# Patient Record
Sex: Male | Born: 1985 | Hispanic: Refuse to answer | Marital: Single | Smoking: Never smoker
Health system: Southern US, Community
[De-identification: ages and names within clinical notes are randomized; demographics above are authoritative.]

---

## 2004-03-04 ENCOUNTER — Emergency Department (HOSPITAL_COMMUNITY): Admission: EM | Admit: 2004-03-04 | Discharge: 2004-03-04 | Payer: Self-pay | Admitting: Emergency Medicine

## 2009-10-05 ENCOUNTER — Emergency Department (HOSPITAL_COMMUNITY): Admission: EM | Admit: 2009-10-05 | Discharge: 2009-10-05 | Payer: Self-pay | Admitting: Emergency Medicine

## 2010-06-01 LAB — BASIC METABOLIC PANEL
BUN: 16 mg/dL (ref 6–23)
CO2: 22 mEq/L (ref 19–32)
Calcium: 9.5 mg/dL (ref 8.4–10.5)
Chloride: 109 mEq/L (ref 96–112)
Creatinine, Ser: 0.81 mg/dL (ref 0.4–1.5)
GFR calc Af Amer: >60 mL/min (ref >60)
GFR calc non Af Amer: >60 mL/min (ref >60)
Glucose, Bld: 103 mg/dL — ABNORMAL HIGH (ref 70–99)
Potassium: 3.5 mEq/L (ref 3.5–5.1)
Sodium: 139 mEq/L (ref 135–145)

## 2010-06-01 LAB — URINALYSIS, ROUTINE W REFLEX MICROSCOPIC
Bilirubin Urine: NEGATIVE
Glucose, UA: NEGATIVE mg/dL
Hgb urine dipstick: NEGATIVE
Ketones, ur: NEGATIVE mg/dL
Nitrite: NEGATIVE
Protein, ur: NEGATIVE mg/dL
Specific Gravity, Urine: 1.03 (ref 1.005–1.030)
Urobilinogen, UA: 0.2 mg/dL (ref 0.0–1.0)
pH: 5.5 (ref 5.0–8.0)

## 2010-06-01 LAB — CBC
HCT: 42.2 % (ref 39.0–52.0)
Hemoglobin: 14.5 g/dL (ref 13.0–17.0)
MCH: 28.9 pg (ref 26.0–34.0)
MCHC: 34.4 g/dL (ref 30.0–36.0)
MCV: 84 fL (ref 78.0–100.0)
Platelets: 180 10*3/uL (ref 150–400)
RBC: 5.03 MIL/uL (ref 4.22–5.81)
RDW: 13.4 % (ref 11.5–15.5)
WBC: 6.5 10*3/uL (ref 4.0–10.5)

## 2010-06-01 LAB — DIFFERENTIAL
Basophils Absolute: 0 10*3/uL (ref 0.0–0.1)
Basophils Relative: 0 % (ref 0–1)
Eosinophils Absolute: 0.6 10*3/uL (ref 0.0–0.7)
Eosinophils Relative: 9 % — ABNORMAL HIGH (ref 0–5)
Lymphocytes Relative: 36 % (ref 12–46)
Lymphs Abs: 2.4 10*3/uL (ref 0.7–4.0)
Monocytes Absolute: 0.4 10*3/uL (ref 0.1–1.0)
Monocytes Relative: 6 % (ref 3–12)
Neutro Abs: 3.1 10*3/uL (ref 1.7–7.7)
Neutrophils Relative %: 48 % (ref 43–77)

## 2010-06-01 LAB — GLUCOSE, CAPILLARY: Glucose-Capillary: 95 mg/dL (ref 70–99)

## 2011-07-02 ENCOUNTER — Emergency Department (HOSPITAL_COMMUNITY)
Admission: EM | Admit: 2011-07-02 | Discharge: 2011-07-02 | Disposition: A | Discharge status: Home / Self Care | Payer: Self-pay | Attending: Emergency Medicine | Admitting: Emergency Medicine

## 2011-07-02 ENCOUNTER — Encounter (HOSPITAL_COMMUNITY): Payer: Self-pay | Admitting: *Deleted

## 2011-07-02 DIAGNOSIS — R110 Nausea: Secondary | ICD-10-CM | POA: Insufficient documentation

## 2011-07-02 DIAGNOSIS — R51 Headache: Secondary | ICD-10-CM | POA: Insufficient documentation

## 2011-07-02 DIAGNOSIS — J329 Chronic sinusitis, unspecified: Secondary | ICD-10-CM | POA: Insufficient documentation

## 2011-07-02 MED ORDER — AZITHROMYCIN 250 MG PO TABS: 250.0000 mg | ORAL_TABLET | Freq: Every day | ORAL | Status: AC

## 2011-07-02 MED ORDER — KETOROLAC TROMETHAMINE 60 MG/2ML IM SOLN
60.0000 mg | Freq: Once | INTRAMUSCULAR | Status: AC
  Administered 2011-07-02: 60 mg via INTRAMUSCULAR
  Filled 2011-07-02: qty 2

## 2011-07-02 MED ORDER — NAPROXEN 500 MG PO TABS: 500.0000 mg | ORAL_TABLET | Freq: Two times a day (BID) | ORAL | Status: AC

## 2011-07-02 MED ORDER — LORATADINE 10 MG PO TABS: 10.0000 mg | ORAL_TABLET | Freq: Every day | ORAL | Status: DC

## 2011-07-02 NOTE — ED Provider Notes (Signed)
History     CSN: [id number]  Arrival date & time 07/02/11  0445   First MD Initiated Contact with Patient 07/02/11 0531      Chief Complaint  Patient presents with  . Headache  . Nausea    (Consider location/radiation/quality/duration/timing/severity/associated sxs/prior treatment) HPI Comments: 26 year old male with a history of headache, left-sided facial pain and nasal discharge with borderline fevers. This started approximately 3 days ago, has been intermittent but becoming more consistent over the day and night. He said one day with no relief of his headache and has now had some nausea. According to the spouse he develop this approximately once a year and they think it is admit to some allergies. They do admit that he gets some sneezing and some eye itching. He has had no medication prior to arrival.  The patient denies diarrhea, rash, abdominal or chest pain, shortness of breath, neck pain or stiffness. He has no change in his hearing and no year pain. He has no change in vision  Patient is a 26 y.o. male presenting with headaches. The history is provided by the patient and the spouse.  Headache     History reviewed. No pertinent past medical history.  History reviewed. No pertinent past surgical history.  History reviewed. No pertinent family history.  History  Substance Use Topics  . Smoking status: Never Smoker   . Smokeless tobacco: Not on file  . Alcohol Use: No      Review of Systems  Neurological: Positive for headaches.  All other systems reviewed and are negative.    Allergies  Review of patient's allergies indicates not on file.  Home Medications   Current Outpatient Rx  Name Route Sig Dispense Refill  . AZITHROMYCIN 250 MG PO TABS Oral Take 1 tablet (250 mg total) by mouth daily. 500mg  PO day 1, then 250mg  PO days 205 6 tablet 0  . LORATADINE 10 MG PO TABS Oral Take 1 tablet (10 mg total) by mouth daily. 30 tablet 0  . NAPROXEN 500 MG PO TABS Oral  Take 1 tablet (500 mg total) by mouth 2 (two) times daily with a meal. 30 tablet 0    BP 108/67  Pulse 77  Temp(Src) 99.1 F (37.3 C) (Oral)  Resp 18  Ht 5\' 3"  (1.6 m)  Wt 164 lb (74.39 kg)  BMI 29.05 kg/m2  SpO2 95%  Physical Exam  Nursing note and vitals reviewed. Constitutional: He appears well-developed and well-nourished. No distress.  HENT:  Head: Normocephalic and atraumatic.  Mouth/Throat: Oropharynx is clear and moist. No oropharyngeal exudate.       Tender to palpation over the left frontal and maxillary sinuses, nose with swollen turbinates and purulent discharge, oropharynx clear with moist mucous membranes. Tympanic membranes normal bilaterally  Eyes: Conjunctivae and EOM are normal. Pupils are equal, round, and reactive to light. Right eye exhibits no discharge. Left eye exhibits no discharge. No scleral icterus.  Neck: Normal range of motion. Neck supple. No JVD present. No thyromegaly present.  Cardiovascular: Normal rate, regular rhythm, normal heart sounds and intact distal pulses.  Exam reveals no gallop and no friction rub.   No murmur heard. Pulmonary/Chest: Effort normal and breath sounds normal. No respiratory distress. He has no wheezes. He has no rales.  Abdominal: Soft. Bowel sounds are normal. He exhibits no distension and no mass. There is no tenderness.  Musculoskeletal: Normal range of motion. He exhibits no edema and no tenderness.  Lymphadenopathy:    He  has no cervical adenopathy.  Neurological: He is alert. Coordination normal.  Skin: Skin is warm and dry. No rash noted. No erythema.  Psychiatric: He has a normal mood and affect. His behavior is normal.    ED Course  Procedures (including critical care time)  Labs Reviewed - No data to display No results found.   1. Sinusitis       MDM  The patient is well-appearing with normal vital signs and no focal neurologic deficits. He is ambulatory, speaks clearly and follows commands without  difficulty. We'll treat with intramuscular Toradol and home with several medications. I suspect this is sinusitis with a resultant headache.  Discharge Prescriptions include:   #1 Naprosyn #2 Zithromax #3 Claritin        Vida Roller, MD 07/02/11 [phone]

## 2011-07-02 NOTE — ED Notes (Signed)
MD at bedside to evaluate.

## 2011-07-02 NOTE — ED Notes (Signed)
Into room to see patient. States he started feeling bad Saturday with body aches and a headache. States headache got worse this morning when he woke up. Is a constant, sharp pain in left side of head and behind eye. Light makes it worse; nothing makes it worse. Has been feeling nauseated this morning. Active bowel sounds in all fields. Soft, flat, nontender. Pupils 4mm bilaterally, PERRLA. Family at bedside. Call bell within reach.

## 2011-07-02 NOTE — ED Notes (Signed)
Pt started feeling feverish with body aches on Sunday, progressed into nausea and headache with no relief x1 day

## 2011-10-02 ENCOUNTER — Emergency Department (HOSPITAL_COMMUNITY)
Admission: EM | Admit: 2011-10-02 | Discharge: 2011-10-02 | Disposition: A | Discharge status: Home / Self Care | Payer: Self-pay | Attending: Emergency Medicine | Admitting: Emergency Medicine

## 2011-10-02 ENCOUNTER — Encounter (HOSPITAL_COMMUNITY): Payer: Self-pay | Admitting: *Deleted

## 2011-10-02 DIAGNOSIS — R21 Rash and other nonspecific skin eruption: Secondary | ICD-10-CM | POA: Insufficient documentation

## 2011-10-02 DIAGNOSIS — L255 Unspecified contact dermatitis due to plants, except food: Secondary | ICD-10-CM

## 2011-10-02 MED ORDER — PREDNISONE 10 MG PO TABS: ORAL_TABLET | ORAL | Status: DC

## 2011-10-02 MED ORDER — DEXAMETHASONE SODIUM PHOSPHATE 4 MG/ML IJ SOLN
10.0000 mg | Freq: Once | INTRAMUSCULAR | Status: AC
  Administered 2011-10-02: 10 mg via INTRAMUSCULAR
  Filled 2011-10-02: qty 3

## 2011-10-02 MED ORDER — DIPHENHYDRAMINE HCL 25 MG PO CAPS
25.0000 mg | ORAL_CAPSULE | Freq: Once | ORAL | Status: AC
  Administered 2011-10-02: 25 mg via ORAL
  Filled 2011-10-02: qty 1

## 2011-10-02 NOTE — ED Provider Notes (Signed)
History     CSN: [id number]  Arrival date & time 10/02/11  1731   First MD Initiated Contact with Patient 10/02/11 1739      Chief Complaint  Patient presents with  . Rash    (Consider location/radiation/quality/duration/timing/severity/associated sxs/prior treatment) Patient is a 26 y.o. male presenting with rash. The history is provided by the patient.  Rash  This is a new problem. The current episode started more than 2 days ago. The problem has been gradually worsening. The problem is associated with plant contact. There has been no fever. The rash is present on the left arm, left upper leg, left lower leg, right arm, right upper leg and right lower leg. The patient is experiencing no pain. Associated symptoms include itching. Pertinent negatives include no blisters, no pain and no weeping. He has tried nothing for the symptoms. The treatment provided no relief.    History reviewed. No pertinent past medical history.  History reviewed. No pertinent past surgical history.  History reviewed. No pertinent family history.  History  Substance Use Topics  . Smoking status: Never Smoker   . Smokeless tobacco: Not on file  . Alcohol Use: No      Review of Systems  Constitutional: Negative for fever, chills, activity change and appetite change.  HENT: Negative for sore throat, facial swelling, trouble swallowing, neck pain and neck stiffness.   Respiratory: Negative for chest tightness, shortness of breath and wheezing.   Skin: Positive for itching and rash. Negative for wound.  Neurological: Negative for dizziness, weakness, numbness and headaches.  All other systems reviewed and are negative.    Allergies  Review of patient's allergies indicates no known allergies.  Home Medications  No current outpatient prescriptions on file.  BP 111/74  Pulse 56  Temp 98 F (36.7 C) (Oral)  Resp 18  Ht 5\' 7"  (1.702 m)  Wt 165 lb (74.844 kg)  BMI 25.84 kg/m2  SpO2  100%  Physical Exam  Nursing note and vitals reviewed. Constitutional: He is oriented to person, place, and time. He appears well-developed and well-nourished. No distress.  HENT:  Head: Normocephalic and atraumatic.  Mouth/Throat: Oropharynx is clear and moist.  Neck: Normal range of motion. Neck supple.  Cardiovascular: Normal rate, regular rhythm and normal heart sounds.   Pulmonary/Chest: Effort normal and breath sounds normal.  Musculoskeletal: He exhibits no edema and no tenderness.  Lymphadenopathy:    He has no cervical adenopathy.  Neurological: He is alert and oriented to person, place, and time. He exhibits normal muscle tone. Coordination normal.  Skin: Rash noted. There is erythema.       Erythematous papular rash to the bilateral upper and lower extremities.  Few linear vesicles also present.  Areas of excoriation to the legs.  No drainage or edema    ED Course  Procedures (including critical care time)  Labs Reviewed - No data to display      MDM     Scattered papular rash to the bilateral upper and lower extremities.  Few areas of excoriation.  Several linear vesicles also present.  Appears c/w plant dermatitis.  No drainage , or edema.  No facial swelling,. Airway patent  The patient appears reasonably screened and/or stabilized for discharge and I doubt any other medical condition or other [facility] requiring further screening, evaluation, or treatment in the ED at this time prior to discharge.   Given IM injection of decadron, po benadryl  Prescribed: Prednisone taper Advised to take OTC benadryl  Berania Peedin L. [place], [place] 10/02/11 1826

## 2011-10-02 NOTE — ED Notes (Signed)
Itching red rash to arms, and legs.

## 2011-10-03 ENCOUNTER — Encounter (HOSPITAL_COMMUNITY): Payer: Self-pay | Admitting: *Deleted

## 2011-10-06 NOTE — ED Provider Notes (Signed)
Medical screening examination/treatment/procedure(s) were performed by non-physician practitioner and as supervising physician I was immediately available for consultation/collaboration.  Almer Bushey, MD 10/06/11 0835 

## 2013-07-13 ENCOUNTER — Encounter (HOSPITAL_COMMUNITY): Payer: Self-pay | Admitting: Emergency Medicine

## 2013-07-13 ENCOUNTER — Emergency Department (HOSPITAL_COMMUNITY)
Admission: EM | Admit: 2013-07-13 | Discharge: 2013-07-13 | Disposition: A | Discharge status: Home / Self Care | Payer: Self-pay | Attending: Emergency Medicine | Admitting: Emergency Medicine

## 2013-07-13 DIAGNOSIS — Y990 Civilian activity done for income or pay: Secondary | ICD-10-CM | POA: Insufficient documentation

## 2013-07-13 DIAGNOSIS — Z23 Encounter for immunization: Secondary | ICD-10-CM | POA: Insufficient documentation

## 2013-07-13 DIAGNOSIS — Z79899 Other long term (current) drug therapy: Secondary | ICD-10-CM | POA: Insufficient documentation

## 2013-07-13 DIAGNOSIS — IMO0002 Reserved for concepts with insufficient information to code with codable children: Secondary | ICD-10-CM

## 2013-07-13 DIAGNOSIS — S61209A Unspecified open wound of unspecified finger without damage to nail, initial encounter: Secondary | ICD-10-CM | POA: Insufficient documentation

## 2013-07-13 DIAGNOSIS — W268XXA Contact with other sharp object(s), not elsewhere classified, initial encounter: Secondary | ICD-10-CM | POA: Insufficient documentation

## 2013-07-13 DIAGNOSIS — Y929 Unspecified place or not applicable: Secondary | ICD-10-CM | POA: Insufficient documentation

## 2013-07-13 DIAGNOSIS — Y9389 Activity, other specified: Secondary | ICD-10-CM | POA: Insufficient documentation

## 2013-07-13 MED ORDER — LIDOCAINE HCL (PF) 1 % IJ SOLN
INTRAMUSCULAR | Status: AC
  Administered 2013-07-13: 23:00:00
  Filled 2013-07-13: qty 5

## 2013-07-13 MED ORDER — TETANUS-DIPHTH-ACELL PERTUSSIS 5-2.5-18.5 LF-MCG/0.5 IM SUSP
0.5000 mL | Freq: Once | INTRAMUSCULAR | Status: AC
  Administered 2013-07-13: 0.5 mL via INTRAMUSCULAR
  Filled 2013-07-13: qty 0.5

## 2013-07-13 MED ORDER — CEPHALEXIN 500 MG PO CAPS: 500.0000 mg | ORAL_CAPSULE | Freq: Four times a day (QID) | ORAL | Status: AC

## 2013-07-13 NOTE — ED Notes (Signed)
Pt reports cutting finger on a piece of metal.  Bleeding controlled at this time.

## 2013-07-13 NOTE — Discharge Instructions (Signed)

## 2013-07-13 NOTE — ED Notes (Signed)
Pt with lac to left index finger by piece of metal, occurred 6-7 hours ago, unknown tetanus shot but believes may have been greater than 10 years

## 2013-07-14 NOTE — ED Provider Notes (Signed)
Medical screening examination/treatment/procedure(s) were performed by non-physician practitioner and as supervising physician I was immediately available for consultation/collaboration.   EKG Interpretation None      Samil Mecham, MD, FACEP   Stanely Sexson L Yalena Colon, MD 07/14/13 2346 

## 2013-07-14 NOTE — ED Provider Notes (Signed)
CSN: [id number]     Arrival date & time 07/13/13  2032 History   First MD Initiated Contact with Patient 07/13/13 2106     Chief Complaint  Patient presents with  . Laceration     (Consider location/radiation/quality/duration/timing/severity/associated sxs/prior Treatment) Patient is a 28 y.o. male presenting with skin laceration. The history is provided by the patient and the spouse.  Laceration Location:  Hand Hand laceration location:  L finger Length (cm):  1 Depth:  Through dermis Quality comment:  Curved Bleeding: controlled with pressure   Time since incident:  6 hours (He was at work and had no transportation to arrive here sooner) Laceration mechanism:  Metal edge Pain details:    Quality:  Aching   Severity:  Mild   Timing:  Constant   Progression:  Unchanged Foreign body present:  No foreign bodies Relieved by:  Pressure Worsened by:  Nothing tried Ineffective treatments:  None tried Tetanus status:  Unknown   History reviewed. No pertinent past medical history. History reviewed. No pertinent past surgical history. History reviewed. No pertinent family history. History  Substance Use Topics  . Smoking status: Never Smoker   . Smokeless tobacco: Not on file  . Alcohol Use: No    Review of Systems  Constitutional: Negative for fever and chills.  Respiratory: Negative for shortness of breath and wheezing.   Skin: Positive for wound.  Neurological: Negative for numbness.      Allergies  Review of patient's allergies indicates no known allergies.  Home Medications   Prior to Admission medications   Medication Sig Start Date End Date Taking? Authorizing Provider  cephALEXin (KEFLEX) 500 MG capsule Take 1 capsule (500 mg total) by mouth 4 (four) times daily. 07/13/13   Burgess AmorJulie Abe Schools, PA-C   BP 124/71  Pulse 63  Temp(Src) 98.1 F (36.7 C) (Oral)  Resp 20  Ht 5\' 6"  (1.676 m)  Wt 170 lb (77.111 kg)  BMI 27.45 kg/m2  SpO2 99% Physical Exam   Constitutional: He is oriented to person, place, and time. He appears well-developed and well-nourished.  HENT:  Head: Normocephalic.  Cardiovascular: Normal rate.   Pulmonary/Chest: Effort normal.  Musculoskeletal: He exhibits no edema and no tenderness.  Neurological: He is alert and oriented to person, place, and time. No sensory deficit.  Skin: Laceration noted.  1 cm laceration dorsal middle phalanx of left index finger.  Hemostatic.  Distal sensation present,  But he endorses slight numbness dorsally. Less than 2 sec distal cap refill.    ED Course  NERVE BLOCK Date/Time: 07/13/2013 9:30 PM Performed by: Burgess AmorIDOL, Naheem Mosco Authorized by: Burgess AmorIDOL, Jaely Silman Consent: Verbal consent obtained. Risks and benefits: risks, benefits and alternatives were discussed Consent given by: patient Patient identity confirmed: verbally with patient Time out: Immediately prior to procedure a "time out" was called to verify the correct patient, procedure, equipment, support staff and site/side marked as required. Indications: pain relief Body area: upper extremity Nerve: digital Laterality: left Preparation: Patient was prepped and draped in the usual sterile fashion. Patient position: sitting Needle gauge: 27 G Location technique: anatomical landmarks Local anesthetic: lidocaine 1% without epinephrine Anesthetic total: 2 ml Outcome: pain improved Patient tolerance: Patient tolerated the procedure well with no immediate complications.   (including critical care time)  LACERATION REPAIR Performed by: Burgess AmorJulie Bralen Wiltgen Authorized by: Burgess AmorJulie Jenness Stemler Consent: Verbal consent obtained. Risks and benefits: risks, benefits and alternatives were discussed Consent given by: patient Patient identity confirmed: provided demographic data Prepped and Draped in normal  sterile fashion Wound explored  Laceration Location: left index finger  Laceration Length: 1cm  No Foreign Bodies seen or palpated  Anesthesia: digital  block Local anesthetic: lidocaine 1% without epinephrine  Anesthetic total: 2 ml  Irrigation method: syringe Amount of cleaning: copious after cleaning with Saf clens spray. Skin closure: ethilon 4-0  Number of sutures: 4  Technique: simple interrupted  Patient tolerance: Patient tolerated the procedure well with no immediate complications.  Labs Review Labs Reviewed - No data to display  Imaging Review No results found.   EKG Interpretation None      MDM   Final diagnoses:  Laceration    Wound care instructions given.  Pt advised to have sutures removed in 10 days,  Return here sooner for any signs of infection including redness, swelling, worse pain or drainage of pus.  Given the age of the wound,  He was placed on keflex for 7 days to help minimize risk of infection.  Tetanus updated.       Burgess AmorJulie Tremeka Helbling, PA-C 07/14/13 [phone]

## 2013-07-24 ENCOUNTER — Encounter (HOSPITAL_COMMUNITY): Payer: Self-pay | Admitting: Emergency Medicine

## 2013-07-24 ENCOUNTER — Emergency Department (HOSPITAL_COMMUNITY)
Admission: EM | Admit: 2013-07-24 | Discharge: 2013-07-24 | Disposition: A | Discharge status: Home / Self Care | Payer: Self-pay | Attending: Emergency Medicine | Admitting: Emergency Medicine

## 2013-07-24 DIAGNOSIS — Z4802 Encounter for removal of sutures: Secondary | ICD-10-CM | POA: Insufficient documentation

## 2013-07-24 DIAGNOSIS — Z79899 Other long term (current) drug therapy: Secondary | ICD-10-CM | POA: Insufficient documentation

## 2013-07-24 DIAGNOSIS — M79609 Pain in unspecified limb: Secondary | ICD-10-CM | POA: Insufficient documentation

## 2013-07-24 NOTE — ED Provider Notes (Signed)
Medical screening examination/treatment/procedure(s) were performed by non-physician practitioner and as supervising physician I was immediately available for consultation/collaboration.   EKG Interpretation None        Brigg Cape L Brailon Don, MD 07/24/13 2131 

## 2013-07-24 NOTE — ED Notes (Signed)
Had sutures placed last Wednesday. Came in for removal of sutures

## 2013-07-24 NOTE — Discharge Instructions (Signed)
He a small area of your to the index finger that has not healed completely. Please leave the Steri-Strip in place, and allow it to come off on its own. Please see your doctor, or return to the emergency department if any unusual swelling, or red streaks going up the arm, or high fever. Please do not apply any lotion or petroleum products to the Steri-Strip area, as this will break down the glue. Suture Removal, Care After Refer to this sheet in the next few weeks. These instructions provide you with information on caring for yourself after your procedure. Your health care provider may also give you more specific instructions. Your treatment has been planned according to current medical practices, but problems sometimes occur. Call your health care provider if you have any problems or questions after your procedure. WHAT TO EXPECT AFTER THE PROCEDURE After your stitches (sutures) are removed, it is typical to have the following:  Some discomfort and swelling in the wound area.  Slight redness in the area. HOME CARE INSTRUCTIONS   If you have skin adhesive strips over the wound area, do not take the strips off. They will fall off on their own in a few days. If the strips remain in place after 14 days, you may remove them.  Change any bandages (dressings) at least once a day or as directed by your health care provider. If the bandage sticks, soak it off with warm, soapy water.  Apply cream or ointment only as directed by your health care provider. If using cream or ointment, wash the area with soap and water 2 times a day to remove all the cream or ointment. Rinse off the soap and pat the area dry with a clean towel.  Keep the wound area dry and clean. If the bandage becomes wet or dirty, or if it develops a bad smell, change it as soon as possible.  Continue to protect the wound from injury.  Use sunscreen when out in the sun. New scars become sunburned easily. SEEK MEDICAL CARE IF:  You have  increasing redness, swelling, or pain in the wound.  You see pus coming from the wound.  You have a fever.  You notice a bad smell coming from the wound or dressing.  Your wound breaks open (edges not staying together). Document Released: 11/26/2000 Document Revised: 12/22/2012 Document Reviewed: 10/13/2012 [facility] Patient Information 2014 [employer], [place].

## 2013-07-24 NOTE — ED Provider Notes (Signed)
CSN: [id number]     Arrival date & time 07/24/13  1844 History  This chart was scribed for [facility]. [name], [place], working with Benny LennertJoseph L Zammit, MD, by Beverly MilchJ Harrison Collins ED Scribe. This patient was seen in room APFT21/APFT21 and the patient's care was started at 6:58 PM.    Chief Complaint  Patient presents with  . Suture / Staple Removal    Patient is a 28 y.o. male presenting with suture removal. The history is provided by the patient. No language interpreter was used.  Suture / Staple Removal This is a new problem. The current episode started more than 2 days ago. The problem occurs constantly. The problem has not changed since onset.Pertinent negatives include no chest pain, no abdominal pain, no headaches and no shortness of breath. Nothing aggravates the symptoms. Nothing relieves the symptoms. He has tried nothing for the symptoms. The treatment provided no relief.    HPI Comments: Francis Garrett is a 28 y.o. male who presents to the Emergency Department for removal of sutures on the left index finger placed 4 days ago.    History reviewed. No pertinent past medical history. History reviewed. No pertinent past surgical history. History reviewed. No pertinent family history. History  Substance Use Topics  . Smoking status: Never Smoker   . Smokeless tobacco: Not on file  . Alcohol Use: No    Review of Systems  Respiratory: Negative for shortness of breath.   Cardiovascular: Negative for chest pain.  Gastrointestinal: Negative for abdominal pain.  Musculoskeletal: Positive for arthralgias.  Neurological: Negative for headaches.  All other systems reviewed and are negative.   Allergies  Review of patient's allergies indicates no known allergies.   Home Medications   Prior to Admission medications   Medication Sig Start Date End Date Taking? Authorizing Provider  cephALEXin (KEFLEX) 500 MG capsule Take 1 capsule (500 mg total) by mouth 4 (four) times daily.  07/13/13   Burgess AmorJulie Idol, PA-C    Triage Vitals: BP 114/66  Pulse 61  Temp(Src) 97.9 F (36.6 C) (Oral)  Ht 5\' 4"  (1.626 m)  Wt 170 lb (77.111 kg)  BMI 29.17 kg/m2  SpO2 100%   Physical Exam  Nursing note and vitals reviewed. Constitutional: He is oriented to person, place, and time. He appears well-developed and well-nourished. No distress.  HENT:  Head: Normocephalic and atraumatic.  Right Ear: External ear normal.  Left Ear: External ear normal.  Eyes: Conjunctivae and EOM are normal. Right eye exhibits no discharge. Left eye exhibits no discharge. No scleral icterus.  Neck: Normal range of motion. Neck supple. No tracheal deviation present.  Cardiovascular: Normal rate.   Pulmonary/Chest: Effort normal. No stridor. No respiratory distress.  Musculoskeletal: He exhibits no edema.       Hands: Neurological: He is alert and oriented to person, place, and time. He has normal reflexes. Cranial nerve deficit: no gross deficits.  Skin: Skin is warm and dry. No rash noted. He is not diaphoretic.  Psychiatric: He has a normal mood and affect. His behavior is normal.    ED Course  SUTURE REMOVAL Date/Time: 07/24/2013 7:09 PM Performed by: Kathie DikeBRYANT, Jacquette Canales M Authorized by: Kathie DikeBRYANT, Oprah Camarena M Consent: Verbal consent obtained. Risks and benefits: risks, benefits and alternatives were discussed Consent given by: patient Patient understanding: patient states understanding of the procedure being performed Patient identity confirmed: arm band Time out: Immediately prior to procedure a "time out" was called to verify the correct patient, procedure, equipment, support staff  and site/side marked as required. Body area: upper extremity Location details: left index finger Wound Appearance: clean Sutures Removed: 3 Staples Removed: 0 Post-removal: Steri-Strips applied Facility: sutures placed in this facility Patient tolerance: Patient tolerated the procedure well with no immediate  complications. Comments: The top layer of skin at the laceration site of the left index finger has not completely closed. One of the sutures had come loose. The area was repaired with a Steri-Strip, with good wound edge approximation.   (including critical care time)  DIAGNOSTIC STUDIES: Oxygen Saturation is 100% on RA, normal by my interpretation.     COORDINATION OF CARE: 7:06 PM- Pt advised of plan for treatment and pt agrees.    Labs Review Labs Reviewed - No data to display  Imaging Review No results found.   EKG Interpretation None      MDM Patient had sutures placed approximately 10 days ago. Examination reveals a small area that has not healed completely. This was repaired with a Steri-Strip. The patient now has good wound edge approximation. The patient's been given instructions to return if any unusual swelling, fever, or red streaks going up the arm. The patient is also been given instructions that the Steri-Strip will come off on its own in 5-7 days. The patient and the family member acknowledge understanding of these discharge instructions.    Final diagnoses:  None    **I have reviewed nursing notes, vital signs, and all appropriate lab and imaging results for this patient.   Kathie DikeHobson M Hymie Gorr, PA-C 07/24/13 [phone]

## 2020-10-07 ENCOUNTER — Emergency Department (HOSPITAL_COMMUNITY)
Admission: EM | Admit: 2020-10-07 | Discharge: 2020-10-07 | Disposition: A | Discharge status: Home / Self Care | Payer: Self-pay | Attending: Emergency Medicine | Admitting: Emergency Medicine

## 2020-10-07 ENCOUNTER — Encounter (HOSPITAL_COMMUNITY): Payer: Self-pay | Admitting: Emergency Medicine

## 2020-10-07 ENCOUNTER — Emergency Department (HOSPITAL_COMMUNITY): Payer: Self-pay

## 2020-10-07 ENCOUNTER — Other Ambulatory Visit: Payer: Self-pay

## 2020-10-07 DIAGNOSIS — R350 Frequency of micturition: Secondary | ICD-10-CM | POA: Insufficient documentation

## 2020-10-07 DIAGNOSIS — R519 Headache, unspecified: Secondary | ICD-10-CM | POA: Insufficient documentation

## 2020-10-07 DIAGNOSIS — R509 Fever, unspecified: Secondary | ICD-10-CM | POA: Insufficient documentation

## 2020-10-07 DIAGNOSIS — K5792 Diverticulitis of intestine, part unspecified, without perforation or abscess without bleeding: Secondary | ICD-10-CM

## 2020-10-07 DIAGNOSIS — Z20822 Contact with and (suspected) exposure to covid-19: Secondary | ICD-10-CM | POA: Insufficient documentation

## 2020-10-07 DIAGNOSIS — R3915 Urgency of urination: Secondary | ICD-10-CM | POA: Insufficient documentation

## 2020-10-07 DIAGNOSIS — K5900 Constipation, unspecified: Secondary | ICD-10-CM | POA: Insufficient documentation

## 2020-10-07 DIAGNOSIS — M545 Low back pain, unspecified: Secondary | ICD-10-CM | POA: Insufficient documentation

## 2020-10-07 DIAGNOSIS — R300 Dysuria: Secondary | ICD-10-CM | POA: Insufficient documentation

## 2020-10-07 DIAGNOSIS — R110 Nausea: Secondary | ICD-10-CM | POA: Insufficient documentation

## 2020-10-07 DIAGNOSIS — R1033 Periumbilical pain: Secondary | ICD-10-CM | POA: Insufficient documentation

## 2020-10-07 LAB — RESP PANEL BY RT-PCR (FLU A&B, COVID) ARPGX2
Influenza A by PCR: NEGATIVE
Influenza B by PCR: NEGATIVE
SARS Coronavirus 2 by RT PCR: NEGATIVE

## 2020-10-07 LAB — LIPASE, BLOOD: Lipase: 24 U/L (ref 11–51)

## 2020-10-07 LAB — COMPREHENSIVE METABOLIC PANEL
ALT: 22 U/L (ref 0–44)
AST: 23 U/L (ref 15–41)
Albumin: 4.4 g/dL (ref 3.5–5.0)
Alkaline Phosphatase: 98 U/L (ref 38–126)
Anion gap: 6 (ref 5–15)
BUN: 12 mg/dL (ref 6–20)
CO2: 24 mmol/L (ref 22–32)
Calcium: 8.5 mg/dL — ABNORMAL LOW (ref 8.9–10.3)
Chloride: 101 mmol/L (ref 98–111)
Creatinine, Ser: 0.78 mg/dL (ref 0.61–1.24)
GFR, Estimated: >60 mL/min (ref >60)
Glucose, Bld: 110 mg/dL — ABNORMAL HIGH (ref 70–99)
Potassium: 3.8 mmol/L (ref 3.5–5.1)
Sodium: 131 mmol/L — ABNORMAL LOW (ref 135–145)
Total Bilirubin: 1.1 mg/dL (ref 0.3–1.2)
Total Protein: 7.1 g/dL (ref 6.5–8.1)

## 2020-10-07 LAB — CBC
HCT: 43.3 % (ref 39.0–52.0)
Hemoglobin: 14.5 g/dL (ref 13.0–17.0)
MCH: 29.5 pg (ref 26.0–34.0)
MCHC: 33.5 g/dL (ref 30.0–36.0)
MCV: 88 fL (ref 80.0–100.0)
Platelets: 153 10*3/uL (ref 150–400)
RBC: 4.92 MIL/uL (ref 4.22–5.81)
RDW: 13.2 % (ref 11.5–15.5)
WBC: 8.6 10*3/uL (ref 4.0–10.5)
nRBC: 0 % (ref 0.0–0.2)

## 2020-10-07 LAB — URINALYSIS, ROUTINE W REFLEX MICROSCOPIC
Bilirubin Urine: NEGATIVE
Glucose, UA: NEGATIVE mg/dL
Hgb urine dipstick: NEGATIVE
Ketones, ur: NEGATIVE mg/dL
Leukocytes,Ua: NEGATIVE
Nitrite: NEGATIVE
Protein, ur: NEGATIVE mg/dL
Specific Gravity, Urine: 1.042 — ABNORMAL HIGH (ref 1.005–1.030)
pH: 6 (ref 5.0–8.0)

## 2020-10-07 MED ORDER — ONDANSETRON HCL 4 MG/2ML IJ SOLN
4.0000 mg | Freq: Once | INTRAMUSCULAR | Status: AC
  Administered 2020-10-07: 4 mg via INTRAVENOUS
  Filled 2020-10-07: qty 2

## 2020-10-07 MED ORDER — IOHEXOL 300 MG/ML  SOLN
100.0000 mL | Freq: Once | INTRAMUSCULAR | Status: AC | PRN
  Administered 2020-10-07: 100 mL via INTRAVENOUS

## 2020-10-07 MED ORDER — SODIUM CHLORIDE 0.9 % IV BOLUS
1000.0000 mL | Freq: Once | INTRAVENOUS | Status: AC
  Administered 2020-10-07: 1000 mL via INTRAVENOUS

## 2020-10-07 MED ORDER — ACETAMINOPHEN 325 MG PO TABS
650.0000 mg | ORAL_TABLET | Freq: Once | ORAL | Status: AC
  Administered 2020-10-07: 650 mg via ORAL
  Filled 2020-10-07: qty 2

## 2020-10-07 MED ORDER — CIPROFLOXACIN HCL 500 MG PO TABS: 500.0000 mg | ORAL_TABLET | Freq: Two times a day (BID) | ORAL | 0 refills | Status: AC

## 2020-10-07 MED ORDER — FENTANYL CITRATE (PF) 100 MCG/2ML IJ SOLN
50.0000 ug | Freq: Once | INTRAMUSCULAR | Status: AC
  Administered 2020-10-07: 50 ug via INTRAVENOUS
  Filled 2020-10-07: qty 2

## 2020-10-07 MED ORDER — METRONIDAZOLE 500 MG PO TABS: 500.0000 mg | ORAL_TABLET | Freq: Two times a day (BID) | ORAL | 0 refills | Status: AC

## 2020-10-07 NOTE — ED Provider Notes (Signed)
7:30 AM-checkout from Dr. Manus Gunning to evaluate patient after return of chest x-ray and urinalysis.  10:55 AM-chest x-ray normal, urinalysis normal.  CT done earlier indicated diverticulitis.  Vital signs today have been reassuring.  At this time, he is comfortable.  Abdomen is soft.  He points to site of abdominal pain just to the left and beneath the umbilicus.  I discussed these findings with him.  He is agreeable to discharging and taking antibiotics.  MDM-acute uncomplicated diverticulitis.  Vital signs normal.  No evidence for UTI, PE, pneumonia.  Viral panel testing negative.  Will discharge with prescriptions for Flagyl and Cipro, low fiber diet, and suggest PCP follow-up in 1 to 2 weeks.  Patient works [employer] so COVID-positive release for 1 week.   Mancel Bale, MD 10/07/20 [phone]

## 2020-10-07 NOTE — ED Triage Notes (Addendum)
Pt with c/o lower abdominal pain, headache, and chills since last night. Pt also c/o flank pain and lower back pain. Pt states he was having trouble having a BM yesterday but was able to go a "little bit" this morning and felt somewhat better but reports that his pain returned. Denies any urinary symptoms.

## 2020-10-07 NOTE — Discharge Instructions (Addendum)
  Keep yourself hydrated at home. Use Tylenol or ibuprofen as needed for aches and fever. CT today showed diverticulitis.  We are treating that with 2 antibiotic prescriptions to improve your condition.  You should follow-up with your primary care doctor for checkup in 2 to 3 weeks after the treatment.  See the attached instructions on treating diverticulitis.  It helps to stay on a low fiber diet.  Make sure you are drinking a lot of liquids.  Return here, if needed.

## 2020-10-07 NOTE — ED Notes (Signed)
Water given; pt given urinal

## 2020-10-07 NOTE — ED Provider Notes (Signed)
[facility] EMERGENCY DEPARTMENT Provider Note   CSN: [id number] Arrival date & time: 10/07/20  7782     History Chief Complaint  Patient presents with   Abdominal Pain   Headache    Francis Garrett is a 35 y.o. male.  Patient complains of lower abdominal pain, low back pain and flank pain with chills, nausea, frequency and urgency since last night.  Febrile on arrival does not know he had a fever at home.  Complains of severe lower abdominal pain below his bellybutton and right side.  States he has been constipated but was able to go yesterday morning a little bit.  States there is no blood in his urine there is no dysuria but he has been peeing frequently.  Denies any testicular pain.  Denies any chest pain or shortness of breath.  Denies any cough, runny nose, sore throat.  Does have a diffuse headache that onset yesterday as well associate with chills.  Denies feeling sore and achy all over just having lower abdominal pain and headache.  No previous abdominal surgeries.  No recent travel.  The history is provided by the patient.  Abdominal Pain Associated symptoms: chills, dysuria, fever and nausea   Associated symptoms: no chest pain, no cough, no shortness of breath and no vomiting   Headache Associated symptoms: abdominal pain, back pain, fever and nausea   Associated symptoms: no congestion, no cough, no dizziness, no vomiting and no weakness       History reviewed. No pertinent past medical history.  There are no problems to display for this patient.   History reviewed. No pertinent surgical history.     History reviewed. No pertinent family history.  Social History   Tobacco Use   Smoking status: Never   Smokeless tobacco: Never  Substance Use Topics   Alcohol use: No   Drug use: No    Home Medications Prior to Admission medications   Medication Sig Start Date End Date Taking? Authorizing Provider  cephALEXin (KEFLEX) 500 MG capsule Take 1  capsule (500 mg total) by mouth 4 (four) times daily. 07/13/13   Burgess Amor, PA-C    Allergies    Patient has no known allergies.  Review of Systems   Review of Systems  Constitutional:  Positive for activity change, appetite change, chills and fever.  HENT:  Negative for congestion and rhinorrhea.   Eyes:  Negative for visual disturbance.  Respiratory:  Negative for cough and shortness of breath.   Cardiovascular:  Negative for chest pain.  Gastrointestinal:  Positive for abdominal pain and nausea. Negative for vomiting.  Genitourinary:  Positive for dysuria, frequency and urgency.  Musculoskeletal:  Positive for back pain.  Skin:  Negative for rash.  Neurological:  Positive for headaches. Negative for dizziness and weakness.   all other systems are negative except as noted in the HPI and PMH.   Physical Exam Updated Vital Signs BP 112/69 (BP Location: Left Arm)   Pulse 81   Temp (!) 100.7 F (38.2 C) (Oral)   Resp 20   Ht 5\' 4"  (1.626 m)   Wt 81.6 kg   SpO2 97%   BMI 30.90 kg/m   Physical Exam Vitals and nursing note reviewed.  Constitutional:      General: He is not in acute distress.    Appearance: He is well-developed.  HENT:     Head: Normocephalic and atraumatic.     Mouth/Throat:     Pharynx: No oropharyngeal exudate.  Eyes:  Conjunctiva/sclera: Conjunctivae normal.     Pupils: Pupils are equal, round, and reactive to light.  Neck:     Comments: No meningismus. Cardiovascular:     Rate and Rhythm: Normal rate and regular rhythm.     Heart sounds: Normal heart sounds. No murmur heard. Pulmonary:     Effort: Pulmonary effort is normal. No respiratory distress.     Breath sounds: Normal breath sounds.  Abdominal:     Palpations: Abdomen is soft.     Tenderness: There is abdominal tenderness. There is guarding. There is no rebound.     Comments: Periumbilical right lower quadrant pain with voluntary guarding.  Genitourinary:    Comments: No testicular  pain Musculoskeletal:        General: Tenderness present. Normal range of motion.     Cervical back: Normal range of motion and neck supple.     Comments: Paraspinal lumbar pain bilateral  Skin:    General: Skin is warm.  Neurological:     Mental Status: He is alert and oriented to person, place, and time.     Cranial Nerves: No cranial nerve deficit.     Motor: No abnormal muscle tone.     Coordination: Coordination normal.     Comments:  5/5 strength throughout. CN 2-12 intact.Equal grip strength.   Psychiatric:        Behavior: Behavior normal.    ED Results / Procedures / Treatments   Labs (all labs ordered are listed, but only abnormal results are displayed) Labs Reviewed  COMPREHENSIVE METABOLIC PANEL - Abnormal; Notable for the following components:      Result Value   Sodium 131 (*)    Glucose, Bld 110 (*)    Calcium 8.5 (*)    All other components within normal limits  RESP PANEL BY RT-PCR (FLU A&B, COVID) ARPGX2  LIPASE, BLOOD  CBC  URINALYSIS, ROUTINE W REFLEX MICROSCOPIC    EKG None  Radiology CT ABDOMEN PELVIS W CONTRAST  Result Date: 10/07/2020 CLINICAL DATA:  Right lower quadrant pain EXAM: CT ABDOMEN AND PELVIS WITH CONTRAST TECHNIQUE: Multidetector CT imaging of the abdomen and pelvis was performed using the standard protocol following bolus administration of intravenous contrast. CONTRAST:  OMNIPAQUE IOHEXOL 300 MG/ML  SOLN COMPARISON:  None available FINDINGS: Lower chest:  No contributory findings. Hepatobiliary: No focal liver abnormality.No evidence of biliary obstruction or stone. Pancreas: Unremarkable. Spleen: Unremarkable. Adrenals/Urinary Tract: Negative adrenals. No hydronephrosis or stone. Unremarkable bladder. Stomach/Bowel: Numerous colonic diverticula with an inflamed diverticulum at the sigmoid colon. Mesenteric edema without abscess or pneumoperitoneum. No appendicitis or bowel obstruction. Vascular/Lymphatic: No acute vascular  abnormality. No mass or adenopathy. Reproductive:No pathologic findings. Other: Trace pelvic fluid which is considered reactive. Musculoskeletal: No acute abnormalities. IMPRESSION: 1. Sigmoid diverticulitis.  No abscess. 2. Normal appendix. Electronically Signed   By: Marnee Spring M.D.   On: 10/07/2020 06:45    Procedures Procedures   Medications Ordered in ED Medications  sodium chloride 0.9 % bolus 1,000 mL (has no administration in time range)  ondansetron (ZOFRAN) injection 4 mg (has no administration in time range)  fentaNYL (SUBLIMAZE) injection 50 mcg (has no administration in time range)    ED Course  I have reviewed the triage vital signs and the nursing notes.  Pertinent labs & imaging results that were available during my care of the patient were reviewed by me and considered in my medical decision making (see chart for details).    MDM Rules/Calculators/A&P  Lower abdominal pain with chills, headache, nausea, frequency and urgency.  Febrile on arrival.  Labs reassuring.  No leukocytosis. Concern for possible covid 19.   However patient does have significant periumbilical right lower quadrant pain.  Imaging will be obtained to evaluate for appendicitis.  CT scan shows normal appendix.  No other acute pathology.  COVID testing is negative. Urinalysis and chest x-ray pending at shift change.  Consider possible viral syndrome, possibly early COVID infection with results yet not positive. Awaiting urinalysis and chest x-ray at shift change.  Anticipate discharge home with supportive care, antipyretics and return precautions.   Franciszek Platten Perez-Gomez was evaluated in Emergency Department on 10/07/2020 for the symptoms described in the history of present illness. He was evaluated in the context of the global COVID-19 pandemic, which necessitated consideration that the patient might be at risk for infection with the SARS-CoV-2 virus that causes  COVID-19. Institutional protocols and algorithms that pertain to the evaluation of patients at risk for COVID-19 are in a state of rapid change based on information released by regulatory bodies including the CDC and federal and state organizations. These policies and algorithms were followed during the patient's care in the ED.  Final Clinical Impression(s) / ED Diagnoses Final diagnoses:  None    Rx / DC Orders ED Discharge Orders     None        Marchell Froman, Jeannett Senior, MD 10/07/20 [phone]

## 2022-02-09 IMAGING — CT CT ABD-PELV W/ CM
2 of 4 series · 17 of 46 positions shown, 19 images · IV contrast (Omnipaque or Isovue)
Comparison: None available

CLINICAL DATA: Right lower quadrant pain

EXAM:
CT ABDOMEN AND PELVIS WITH CONTRAST
TECHNIQUE: Multidetector CT imaging of the abdomen and pelvis was performed
using the standard protocol following bolus administration of
intravenous contrast.
CONTRAST:  100mL OMNIPAQUE IOHEXOL 300 MG/ML  SOLN

[Series 2: axial st · axial · 0.88mm/px · z∈[+822,+1282]mm · 14 of 102 slices shown, 16 images]
[im 5/102  soft-tissue]
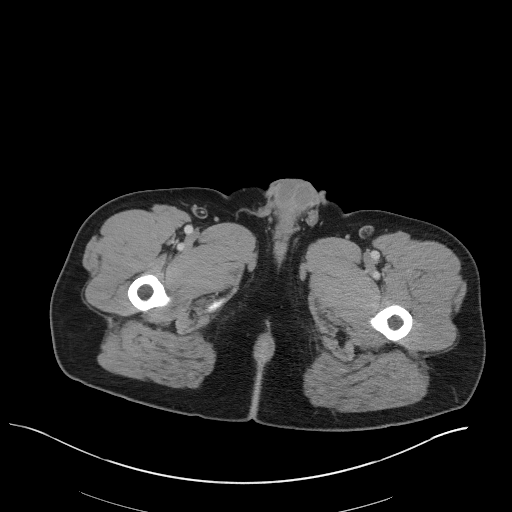
[im 5/102  bone]
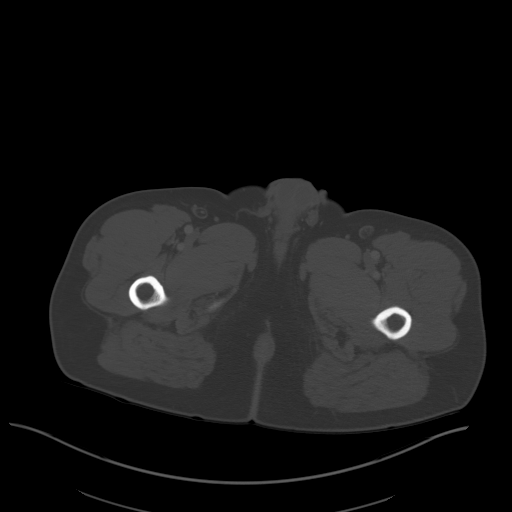
[im 13/102  soft-tissue]
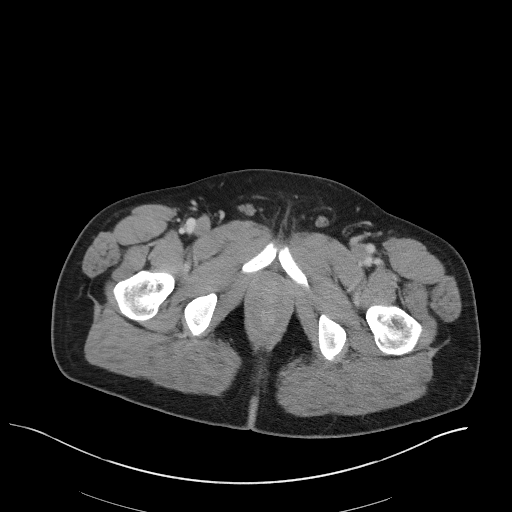
[im 22/102  soft-tissue]
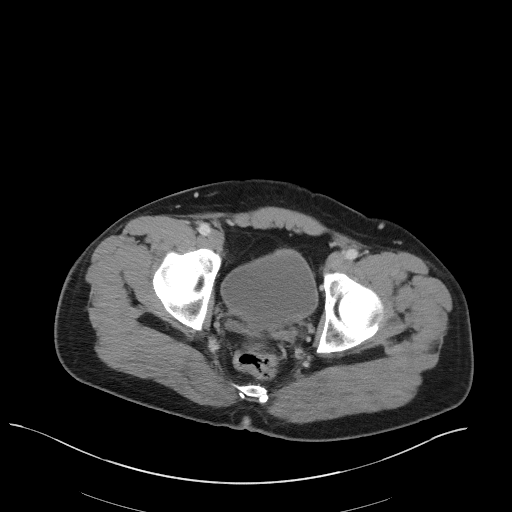
[im 26/102  soft-tissue]
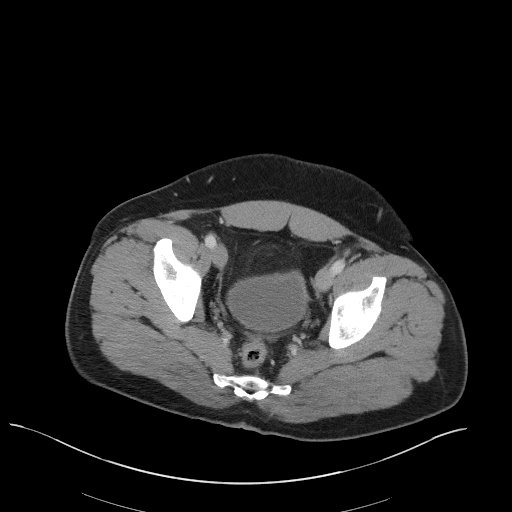
[im 34/102  soft-tissue]
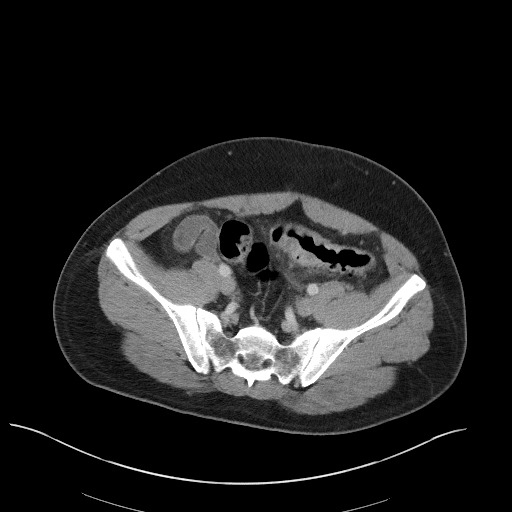
[im 43/102  soft-tissue]
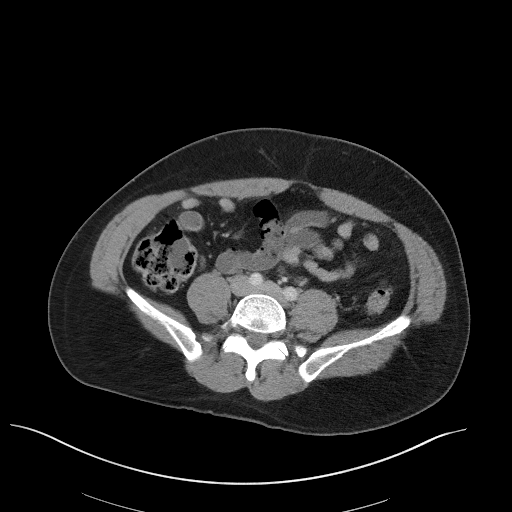
[im 47/102  soft-tissue]
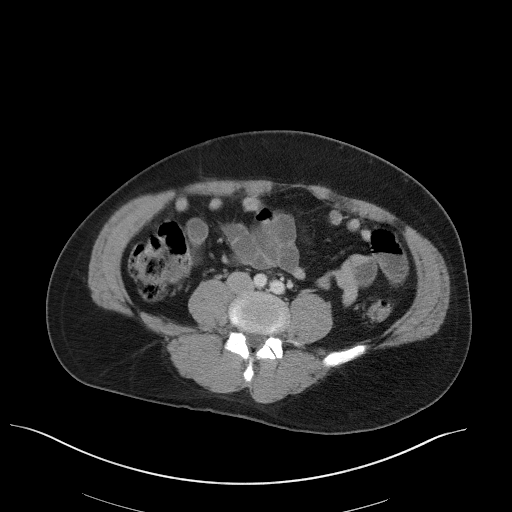
[im 55/102  soft-tissue]
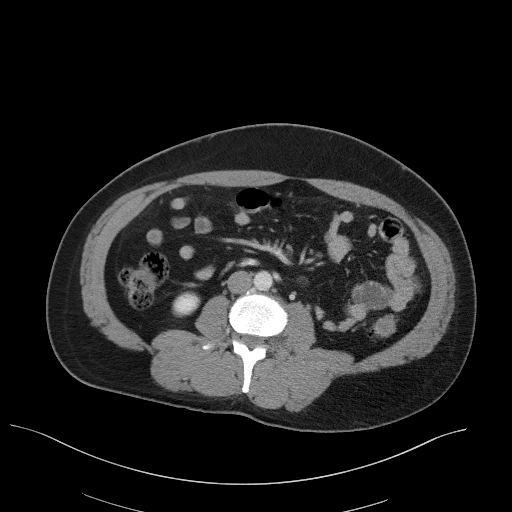
[im 59/102  soft-tissue]
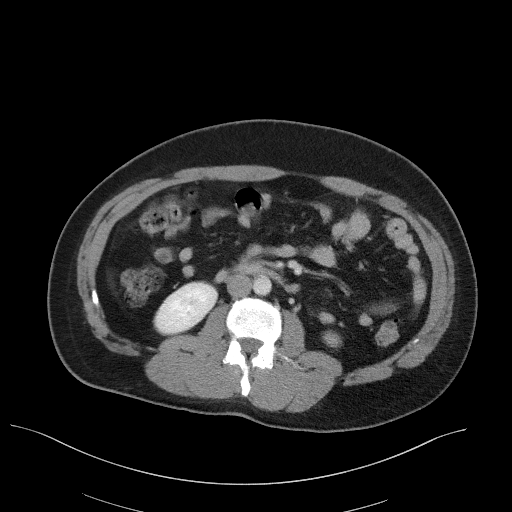
[im 59/102  bone]
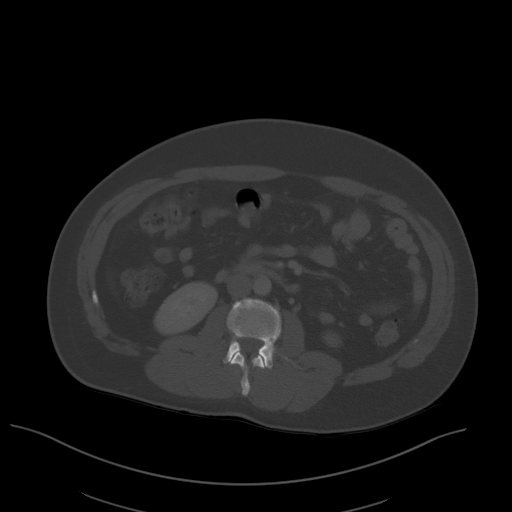
[im 68/102  soft-tissue]
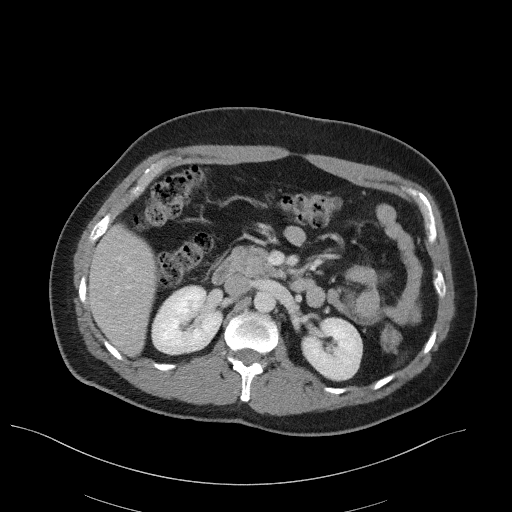
[im 76/102  soft-tissue]
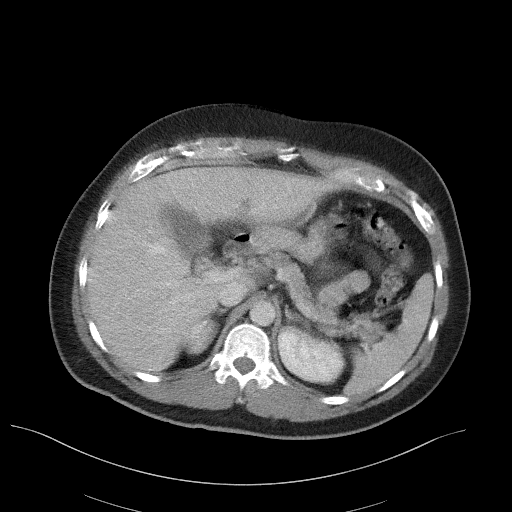
[im 80/102  soft-tissue]
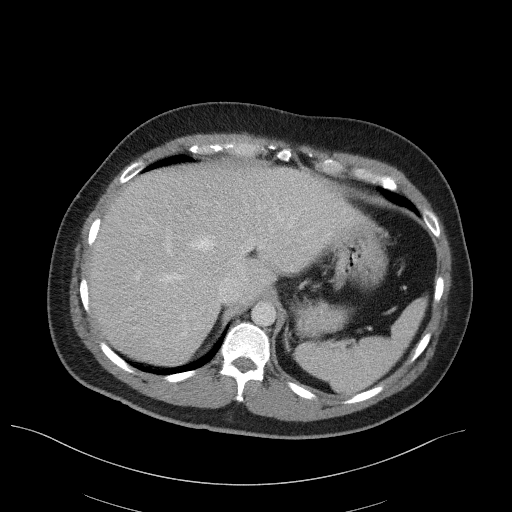
[im 89/102  soft-tissue]
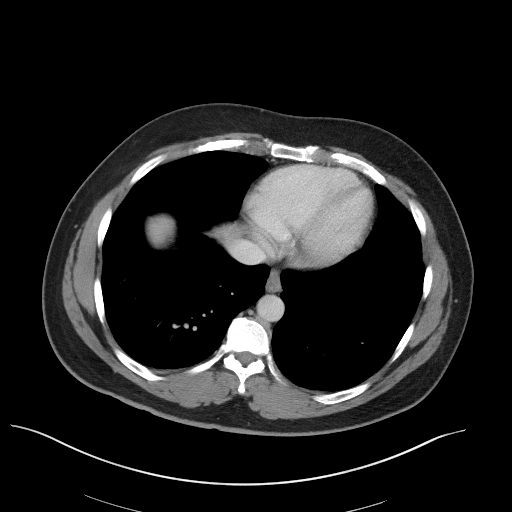
[im 97/102  soft-tissue]
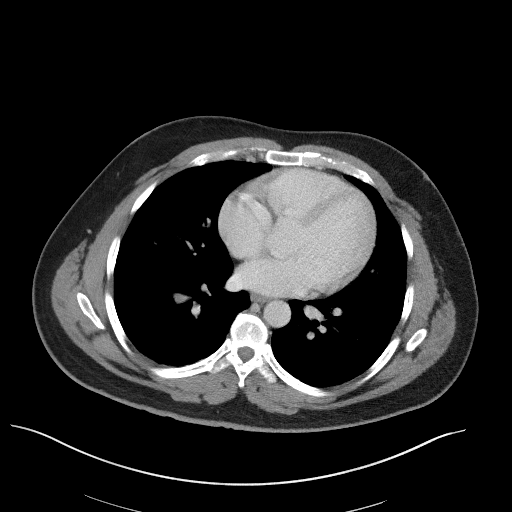

[Series 5: coronal st · coronal · 0.79mm/px · 3 of 108 slices shown]
[im 36/108  soft-tissue]
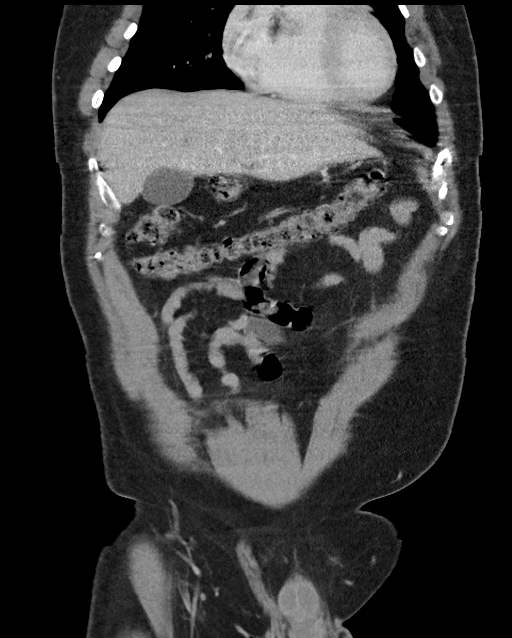
[im 48/108  soft-tissue]
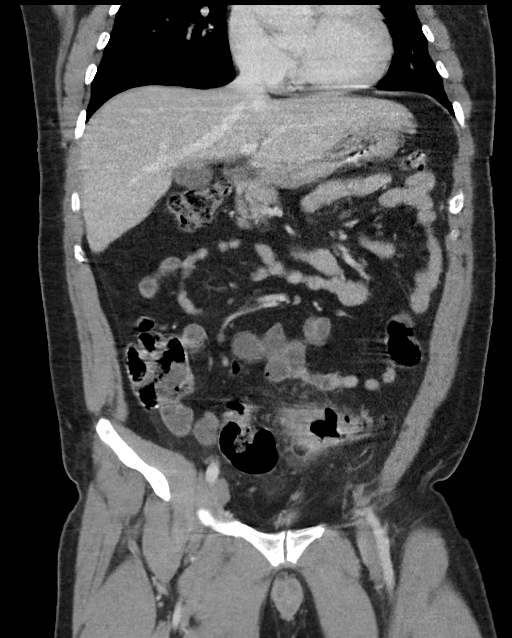
[im 60/108  soft-tissue]
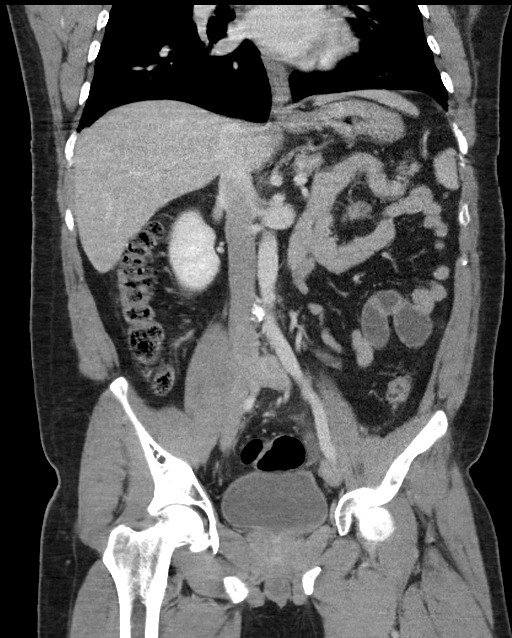

[17 of 46 positions shown; findings below may reference images not displayed]

FINDINGS: Lower chest:  No contributory findings.

Hepatobiliary: No focal liver abnormality.No evidence of biliary
obstruction or stone.

Pancreas: Unremarkable.

Spleen: Unremarkable.

Adrenals/Urinary Tract: Negative adrenals. No hydronephrosis or
stone. Unremarkable bladder.

Stomach/Bowel: Numerous colonic diverticula with an inflamed
diverticulum at the sigmoid colon. Mesenteric edema without abscess
or pneumoperitoneum. No appendicitis or bowel obstruction.

Vascular/Lymphatic: No acute vascular abnormality. No mass or
adenopathy.

Reproductive:No pathologic findings.

Other: Trace pelvic fluid which is considered reactive.

Musculoskeletal: No acute abnormalities.
IMPRESSION: 1. Sigmoid diverticulitis.  No abscess.
2. Normal appendix.
